# Patient Record
Sex: Male | Born: 1985 | Race: White | Hispanic: No | Marital: Single
Health system: Southern US, Community
[De-identification: ages and names within clinical notes are randomized; demographics above are authoritative.]

## PROBLEM LIST (undated history)

## (undated) DIAGNOSIS — K219 Gastro-esophageal reflux disease without esophagitis: Secondary | ICD-10-CM

## (undated) DIAGNOSIS — F419 Anxiety disorder, unspecified: Secondary | ICD-10-CM

## (undated) DIAGNOSIS — E669 Obesity, unspecified: Secondary | ICD-10-CM

## (undated) HISTORY — DX: Gastro-esophageal reflux disease without esophagitis: K21.9

## (undated) HISTORY — DX: Obesity, unspecified: E66.9

---

## 2008-10-25 ENCOUNTER — Emergency Department (HOSPITAL_COMMUNITY): Admission: EM | Admit: 2008-10-25 | Discharge: 2008-10-25 | Payer: Self-pay | Admitting: Emergency Medicine

## 2011-04-18 ENCOUNTER — Emergency Department (HOSPITAL_COMMUNITY): Payer: BC Managed Care – PPO

## 2011-04-18 ENCOUNTER — Emergency Department (HOSPITAL_COMMUNITY)
Admission: EM | Admit: 2011-04-18 | Discharge: 2011-04-19 | Disposition: A | Discharge status: Home / Self Care | Payer: BC Managed Care – PPO | Attending: Emergency Medicine | Admitting: Emergency Medicine

## 2011-04-18 DIAGNOSIS — R0989 Other specified symptoms and signs involving the circulatory and respiratory systems: Secondary | ICD-10-CM | POA: Insufficient documentation

## 2011-04-18 DIAGNOSIS — R55 Syncope and collapse: Secondary | ICD-10-CM | POA: Insufficient documentation

## 2011-04-18 DIAGNOSIS — R5381 Other malaise: Secondary | ICD-10-CM | POA: Insufficient documentation

## 2011-04-18 DIAGNOSIS — R209 Unspecified disturbances of skin sensation: Secondary | ICD-10-CM | POA: Insufficient documentation

## 2011-04-18 DIAGNOSIS — R0602 Shortness of breath: Secondary | ICD-10-CM | POA: Insufficient documentation

## 2011-04-18 DIAGNOSIS — R064 Hyperventilation: Secondary | ICD-10-CM | POA: Insufficient documentation

## 2011-04-18 DIAGNOSIS — F411 Generalized anxiety disorder: Secondary | ICD-10-CM | POA: Insufficient documentation

## 2011-04-18 DIAGNOSIS — R079 Chest pain, unspecified: Secondary | ICD-10-CM | POA: Insufficient documentation

## 2011-04-18 DIAGNOSIS — R0609 Other forms of dyspnea: Secondary | ICD-10-CM | POA: Insufficient documentation

## 2011-04-18 LAB — POCT I-STAT, CHEM 8
Chloride: 104 mEq/L (ref 96–112)
Glucose, Bld: 99 mg/dL (ref 70–99)
HCT: 48 % (ref 39.0–52.0)
Potassium: 3.8 mEq/L (ref 3.5–5.1)

## 2011-04-18 LAB — CK TOTAL AND CKMB (NOT AT ARMC)
CK, MB: 2.1 ng/mL (ref 0.3–4.0)
Relative Index: 1.3 (ref 0.0–2.5)
Total CK: 168 U/L (ref 7–232)

## 2011-04-18 LAB — DIFFERENTIAL
Eosinophils Relative: 0 % (ref 0–5)
Lymphocytes Relative: 16 % (ref 12–46)
Lymphs Abs: 2.3 10*3/uL (ref 0.7–4.0)
Neutrophils Relative %: 75 % (ref 43–77)

## 2011-04-18 LAB — CBC
HCT: 44.5 % (ref 39.0–52.0)
Hemoglobin: 15.6 g/dL (ref 13.0–17.0)
MCV: 89.2 fL (ref 78.0–100.0)
RBC: 4.99 MIL/uL (ref 4.22–5.81)
WBC: 14.4 10*3/uL — ABNORMAL HIGH (ref 4.0–10.5)

## 2011-04-18 LAB — D-DIMER, QUANTITATIVE: D-Dimer, Quant: 0.22 ug/mL-FEU (ref 0.00–0.48)

## 2011-04-19 LAB — POCT I-STAT TROPONIN I: Troponin i, poc: 0.01 ng/mL (ref 0.00–0.08)

## 2011-06-08 ENCOUNTER — Emergency Department (HOSPITAL_COMMUNITY)
Admission: EM | Admit: 2011-06-08 | Discharge: 2011-06-08 | Disposition: A | Discharge status: Home / Self Care | Payer: BC Managed Care – PPO | Attending: Emergency Medicine | Admitting: Emergency Medicine

## 2011-06-08 ENCOUNTER — Emergency Department (HOSPITAL_COMMUNITY): Payer: BC Managed Care – PPO

## 2011-06-08 DIAGNOSIS — R0989 Other specified symptoms and signs involving the circulatory and respiratory systems: Secondary | ICD-10-CM | POA: Insufficient documentation

## 2011-06-08 DIAGNOSIS — R209 Unspecified disturbances of skin sensation: Secondary | ICD-10-CM | POA: Insufficient documentation

## 2011-06-08 DIAGNOSIS — R0789 Other chest pain: Secondary | ICD-10-CM | POA: Insufficient documentation

## 2011-06-08 DIAGNOSIS — R000 Tachycardia, unspecified: Secondary | ICD-10-CM | POA: Insufficient documentation

## 2011-06-08 DIAGNOSIS — F411 Generalized anxiety disorder: Secondary | ICD-10-CM | POA: Insufficient documentation

## 2011-06-08 DIAGNOSIS — Z79899 Other long term (current) drug therapy: Secondary | ICD-10-CM | POA: Insufficient documentation

## 2011-06-08 DIAGNOSIS — R0602 Shortness of breath: Secondary | ICD-10-CM | POA: Insufficient documentation

## 2011-06-08 DIAGNOSIS — R0609 Other forms of dyspnea: Secondary | ICD-10-CM | POA: Insufficient documentation

## 2011-06-08 DIAGNOSIS — R002 Palpitations: Secondary | ICD-10-CM | POA: Insufficient documentation

## 2011-06-08 LAB — POCT I-STAT, CHEM 8
BUN: 12 mg/dL (ref 6–23)
Calcium, Ion: 1.12 mmol/L (ref 1.12–1.32)
Creatinine, Ser: 1.1 mg/dL (ref 0.50–1.35)
TCO2: 24 mmol/L (ref 0–100)

## 2011-06-08 LAB — DIFFERENTIAL
Eosinophils Relative: 1 % (ref 0–5)
Lymphocytes Relative: 30 % (ref 12–46)
Lymphs Abs: 2.8 10*3/uL (ref 0.7–4.0)
Monocytes Absolute: 0.7 10*3/uL (ref 0.1–1.0)
Neutro Abs: 5.5 10*3/uL (ref 1.7–7.7)

## 2011-06-08 LAB — CBC
HCT: 45.2 % (ref 39.0–52.0)
Hemoglobin: 15.9 g/dL (ref 13.0–17.0)
MCHC: 35.2 g/dL (ref 30.0–36.0)
MCV: 90 fL (ref 78.0–100.0)
RDW: 13.3 % (ref 11.5–15.5)
WBC: 9.1 10*3/uL (ref 4.0–10.5)

## 2011-07-11 ENCOUNTER — Encounter: Payer: Self-pay | Admitting: *Deleted

## 2011-07-11 ENCOUNTER — Emergency Department (HOSPITAL_COMMUNITY)
Admission: EM | Admit: 2011-07-11 | Discharge: 2011-07-11 | Disposition: A | Discharge status: Home / Self Care | Payer: BC Managed Care – PPO | Attending: Emergency Medicine | Admitting: Emergency Medicine

## 2011-07-11 DIAGNOSIS — F411 Generalized anxiety disorder: Secondary | ICD-10-CM | POA: Insufficient documentation

## 2011-07-11 DIAGNOSIS — F419 Anxiety disorder, unspecified: Secondary | ICD-10-CM

## 2011-07-11 HISTORY — DX: Anxiety disorder, unspecified: F41.9

## 2011-07-11 MED ORDER — ALPRAZOLAM 0.25 MG PO TABS: 0.2500 mg | ORAL_TABLET | Freq: Every evening | ORAL | Status: DC | PRN

## 2011-07-11 NOTE — ED Provider Notes (Signed)
History     CSN: [id number] Arrival date & time: 07/11/2011  5:32 PM   First MD Initiated Contact with Patient 07/11/11 1826      Chief Complaint  Patient presents with  . Anxiety    (Consider location/radiation/quality/duration/timing/severity/associated sxs/prior treatment) Patient is a 25 y.o. male presenting with anxiety. The history is provided by the patient.  Anxiety This is a chronic problem. The current episode started more than 1 year ago. The problem occurs every several days. The problem has been resolved. Pertinent negatives include no abdominal pain, anorexia, arthralgias, change in bowel habit, chest pain, chills, fatigue, fever, headaches, joint swelling, rash, sore throat, swollen glands or urinary symptoms. The symptoms are aggravated by nothing.   Pt presents to the ED with complaints of recurrent anxiety attacks. He sees a primary care provider at [facility] for it and is out of his Xanax. He took his last pill yesterday with the extra anxiety. He states that he is going to see his doctor tomorrow for more refills.  Past Medical History  Diagnosis Date  . Anxiety     History reviewed. No pertinent past surgical history.  No family history on file.  History  Substance Use Topics  . Smoking status: Not on file  . Smokeless tobacco: Not on file  . Alcohol Use:       Review of Systems  Constitutional: Negative for fever, chills and fatigue.  HENT: Negative for sore throat.   Cardiovascular: Negative for chest pain.  Gastrointestinal: Negative for abdominal pain, anorexia and change in bowel habit.  Musculoskeletal: Negative for joint swelling and arthralgias.  Skin: Negative for rash.  Neurological: Negative for headaches.  All other systems reviewed and are negative.    Allergies  Penicillins  Home Medications   Current Outpatient Rx  Name Route Sig Dispense Refill  . ALPRAZOLAM 0.25 MG PO TABS Oral Take 0.25 mg by mouth 3 (three) times daily as  needed. For anxiety.       BP 144/86  Pulse 23  Temp(Src) 99.3 F (37.4 C) (Oral)  Resp 23  SpO2 99%  Physical Exam  Nursing note and vitals reviewed. Constitutional: He is oriented to person, place, and time. He appears well-developed and well-nourished.  HENT:  Head: Normocephalic and atraumatic.  Eyes: EOM are normal. Pupils are equal, round, and reactive to light.  Neck: Normal range of motion.  Cardiovascular: Normal rate and regular rhythm.   Pulmonary/Chest: Effort normal and breath sounds normal.  Musculoskeletal: Normal range of motion.  Neurological: He is alert and oriented to person, place, and time.  Skin: Skin is warm and dry.    ED Course  Procedures (including critical care time)  Labs Reviewed - No data to display No results found.   No diagnosis found.    MDM         Dorthula Matas, PA 07/11/11 [phone]

## 2011-07-11 NOTE — ED Notes (Signed)
  Per [facility]- pt reports indigestion about 1 our ago- Per EMS seen and treated multiple times for presenting complaint- Pt reports out of anxiety medication last xanax taken 1 hour ago.

## 2011-07-11 NOTE — ED Provider Notes (Signed)
Medical screening examination/treatment/procedure(s) were performed by non-physician practitioner and as supervising physician I was immediately available for consultation/collaboration.   Joya Gaskins, MD 07/11/11 2325

## 2011-07-29 ENCOUNTER — Emergency Department (HOSPITAL_COMMUNITY)
Admission: EM | Admit: 2011-07-29 | Discharge: 2011-07-29 | Discharge status: Against Medical Advice | Payer: BC Managed Care – PPO | Attending: Emergency Medicine | Admitting: Emergency Medicine

## 2011-07-29 ENCOUNTER — Encounter (HOSPITAL_COMMUNITY): Payer: Self-pay | Admitting: *Deleted

## 2011-07-29 DIAGNOSIS — R079 Chest pain, unspecified: Secondary | ICD-10-CM | POA: Insufficient documentation

## 2011-07-29 DIAGNOSIS — R0602 Shortness of breath: Secondary | ICD-10-CM | POA: Insufficient documentation

## 2011-07-29 NOTE — ED Notes (Signed)
Has been having chest pain, radiation to left arm, sob, dizziness x 4 months, has been seen here for same in past. No acute distress noted at triage.

## 2014-03-16 ENCOUNTER — Encounter: Payer: Self-pay | Admitting: *Deleted

## 2018-06-03 ENCOUNTER — Emergency Department: Payer: BLUE CROSS/BLUE SHIELD

## 2018-06-03 ENCOUNTER — Other Ambulatory Visit: Payer: Self-pay

## 2018-06-03 ENCOUNTER — Emergency Department
Admission: EM | Admit: 2018-06-03 | Discharge: 2018-06-04 | Disposition: A | Discharge status: Home / Self Care | Payer: BLUE CROSS/BLUE SHIELD | Attending: Emergency Medicine | Admitting: Emergency Medicine

## 2018-06-03 DIAGNOSIS — Z23 Encounter for immunization: Secondary | ICD-10-CM | POA: Diagnosis not present

## 2018-06-03 DIAGNOSIS — Y999 Unspecified external cause status: Secondary | ICD-10-CM | POA: Insufficient documentation

## 2018-06-03 DIAGNOSIS — Y9389 Activity, other specified: Secondary | ICD-10-CM | POA: Diagnosis not present

## 2018-06-03 DIAGNOSIS — S61212A Laceration without foreign body of right middle finger without damage to nail, initial encounter: Secondary | ICD-10-CM | POA: Diagnosis not present

## 2018-06-03 DIAGNOSIS — W25XXXA Contact with sharp glass, initial encounter: Secondary | ICD-10-CM | POA: Diagnosis not present

## 2018-06-03 DIAGNOSIS — Y92018 Other place in single-family (private) house as the place of occurrence of the external cause: Secondary | ICD-10-CM | POA: Insufficient documentation

## 2018-06-03 DIAGNOSIS — Z79899 Other long term (current) drug therapy: Secondary | ICD-10-CM | POA: Insufficient documentation

## 2018-06-03 MED ORDER — LIDOCAINE HCL (PF) 1 % IJ SOLN
5.0000 mL | Freq: Once | INTRAMUSCULAR | Status: AC
  Administered 2018-06-04: 5 mL
  Filled 2018-06-03: qty 5

## 2018-06-03 NOTE — ED Triage Notes (Addendum)
Pt in with co laceration to right 3rd finger with glass cup at home.

## 2018-06-04 MED ORDER — OXYCODONE-ACETAMINOPHEN 5-325 MG PO TABS
1.0000 | ORAL_TABLET | Freq: Once | ORAL | Status: AC
  Administered 2018-06-04: 1 via ORAL
  Filled 2018-06-04: qty 1

## 2018-06-04 MED ORDER — TETANUS-DIPHTH-ACELL PERTUSSIS 5-2.5-18.5 LF-MCG/0.5 IM SUSP
0.5000 mL | Freq: Once | INTRAMUSCULAR | Status: AC
  Administered 2018-06-04: 0.5 mL via INTRAMUSCULAR
  Filled 2018-06-04: qty 0.5

## 2018-06-04 NOTE — Discharge Instructions (Signed)
Keep laceration dry and clean. Wash with warm water and soap. Apply topical bacitracin. Protect from the sun to minimize scarring.  You received 3 stitches that must be removed in 7 days.  Watch for signs of infection: pus, redness of the skin surrounding it, or fever. If these develop see your doctor or return to the ER for antibiotics.

## 2018-06-04 NOTE — ED Provider Notes (Signed)
[facility] Emergency Department Provider Note  ____________________________________________  Time seen: Approximately 12:52 AM  I have reviewed the triage vital signs and the nursing notes.   HISTORY  Chief Complaint Laceration   HPI Devin Sullivan is a 32 y.o. male who presents for evaluation of a finger laceration.  Patient reports that he cut his finger at a broken glass cup at home.  No laceration happened just prior to arrival.  Patient is complaining of moderate sharp pain that is constant since it happened.  No other injuries.  Last tetanus shot is unknown.  Past Medical History:  Diagnosis Date  . Anxiety   . GERD (gastroesophageal reflux disease)   . Obesity     There are no active problems to display for this patient.   No past surgical history on file.  Prior to Admission medications   Medication Sig Start Date End Date Taking? Authorizing Provider  ALPRAZolam (XANAX) 0.25 MG tablet Take 0.25 mg by mouth 3 (three) times daily as needed. For anxiety.     [provider]  PARoxetine (PAXIL) 10 MG tablet Take 10 mg by mouth every morning.      [provider]    Allergies Penicillins  No family history on file.  Social History Social History   Tobacco Use  . Smoking status: Not on file  Substance Use Topics  . Alcohol use: No  . Drug use: No    Review of Systems  Constitutional: Negative for fever. Cardiovascular: Negative for chest pain. Respiratory: Negative for shortness of breath. Gastrointestinal: Negative for abdominal pain, vomiting or diarrhea. Musculoskeletal: Negative for back pain. + finger laceration Skin: Negative for rash. Neurological: Negative for headaches, weakness or numbness. Psych: No SI or HI  ____________________________________________   PHYSICAL EXAM:  VITAL SIGNS: ED Triage Vitals  Enc Vitals Group     BP 06/03/18 2336 133/85     Pulse Rate 06/03/18 2335 76   Resp 06/03/18 2335 20     Temp 06/03/18 2335 97.6 F (36.4 C)     Temp Source 06/03/18 2335 Oral     SpO2 06/03/18 2335 99 %     Weight 06/03/18 2335 280 lb (127 kg)     Height 06/03/18 2335 6\' 2"  (1.88 m)     Head Circumference --      Peak Flow --      Pain Score 06/03/18 2335 8     Pain Loc --      Pain Edu? --      Excl. in GC? --     Constitutional: Alert and oriented. Well appearing and in no apparent distress. HEENT:      Head: Normocephalic and atraumatic.         Eyes: Conjunctivae are normal. Sclera is non-icteric.       Mouth/Throat: Mucous membranes are moist.       Neck: Supple with no signs of meningismus. Cardiovascular: Regular rate and rhythm. No murmurs, gallops, or rubs. 2+ symmetrical distal pulses are present in all extremities. No JVD. Respiratory: Normal respiratory effort. Lungs are clear to auscultation bilaterally. No wheezes, crackles, or rhonchi.  Musculoskeletal: Superficial 2 cm laceration located over the right third PIP joint in the volar aspect, no tendon involvement. Neurologic: Normal speech and language. Face is symmetric. Moving all extremities. No gross focal neurologic deficits are appreciated. Skin: Skin is warm, dry and intact. No rash noted. Psychiatric: Mood and affect are normal. Speech and behavior are  normal.  ____________________________________________   LABS (all labs ordered are listed, but only abnormal results are displayed)  Labs Reviewed - No data to display ____________________________________________  EKG  none  ____________________________________________  RADIOLOGY  I have personally reviewed the images performed during this visit and I agree with the Radiologist's read.   Interpretation by Radiologist:  Dg Hand 2 View Right  Result Date: 06/04/2018 CLINICAL DATA:  Laceration third digit with glass EXAM: RIGHT HAND - 2 VIEW COMPARISON:  None. FINDINGS: There is no evidence of fracture or dislocation. There is  no evidence of arthropathy or other focal bone abnormality. Soft tissues are unremarkable. IMPRESSION: Negative. Electronically Signed   By: Jasmine Pang M.D.   On: 06/04/2018 00:28     ____________________________________________   PROCEDURES  Procedure(s) performed:yes .Marland KitchenLaceration Repair Date/Time: 06/04/2018 12:54 AM Performed by: Nita Sickle, MD Authorized by: Nita Sickle, MD   Consent:    Consent obtained:  Verbal   Consent given by:  Patient   Risks discussed:  Infection, pain, retained foreign body, poor cosmetic result and poor wound healing Anesthesia (see MAR for exact dosages):    Anesthesia method:  Nerve block   Block location:  Finger   Block needle gauge:  25 G   Block anesthetic:  Lidocaine 1% w/o epi   Block injection procedure:  Anatomic landmarks identified   Block outcome:  Anesthesia achieved Laceration details:    Location:  Finger   Finger location:  R long finger   Length (cm):  2 Repair type:    Repair type:  Simple Exploration:    Hemostasis achieved with:  Direct pressure   Wound exploration: entire depth of wound probed and visualized     Wound extent: no foreign bodies/material noted, no tendon damage noted and no underlying fracture noted     Contaminated: no   Treatment:    Area cleansed with:  Saline   Amount of cleaning:  Extensive   Irrigation solution:  Sterile saline   Visualized foreign bodies/material removed: no   Skin repair:    Repair method:  Sutures   Suture size:  5-0   Suture material:  Nylon   Suture technique:  Simple interrupted   Number of sutures:  3 Approximation:    Approximation:  Close Post-procedure details:    Dressing:  Adhesive bandage   Patient tolerance of procedure:  Tolerated well, no immediate complications   Critical Care performed:  None ____________________________________________   INITIAL IMPRESSION / ASSESSMENT AND PLAN / ED COURSE  32 y.o. male who presents for evaluation  of a finger laceration.  Neurovascular exam is intact.  No tendon injury.  Laceration repair per procedure note above.  Discussed wound care with patient and his wife and return precautions for any signs of infection.  Otherwise follow-up in 7 days for stitch removal.  Tetanus updated.      As part of my medical decision making, I reviewed the following data within the electronic MEDICAL RECORD NUMBER Nursing notes reviewed and incorporated, Radiograph reviewed , Notes from prior ED visits and [place] Controlled Substance Database    Pertinent labs & imaging results that were available during my care of the patient were reviewed by me and considered in my medical decision making (see chart for details).    ____________________________________________   FINAL CLINICAL IMPRESSION(S) / ED DIAGNOSES  Final diagnoses:  Laceration of right middle finger without foreign body without damage to nail, initial encounter      NEW MEDICATIONS  STARTED DURING THIS VISIT:  ED Discharge Orders    None       Note:  This document was prepared using Dragon voice recognition software and may include unintentional dictation errors.    Nita Sickle, MD 06/04/18 [phone]

## 2020-09-04 IMAGING — DX DG HAND 2V*R*
2 series · 2 of 2 positions shown · non-contrast
Comparison: None.

CLINICAL DATA: Laceration third digit with glass

EXAM:
RIGHT HAND - 2 VIEW

[hand ap]
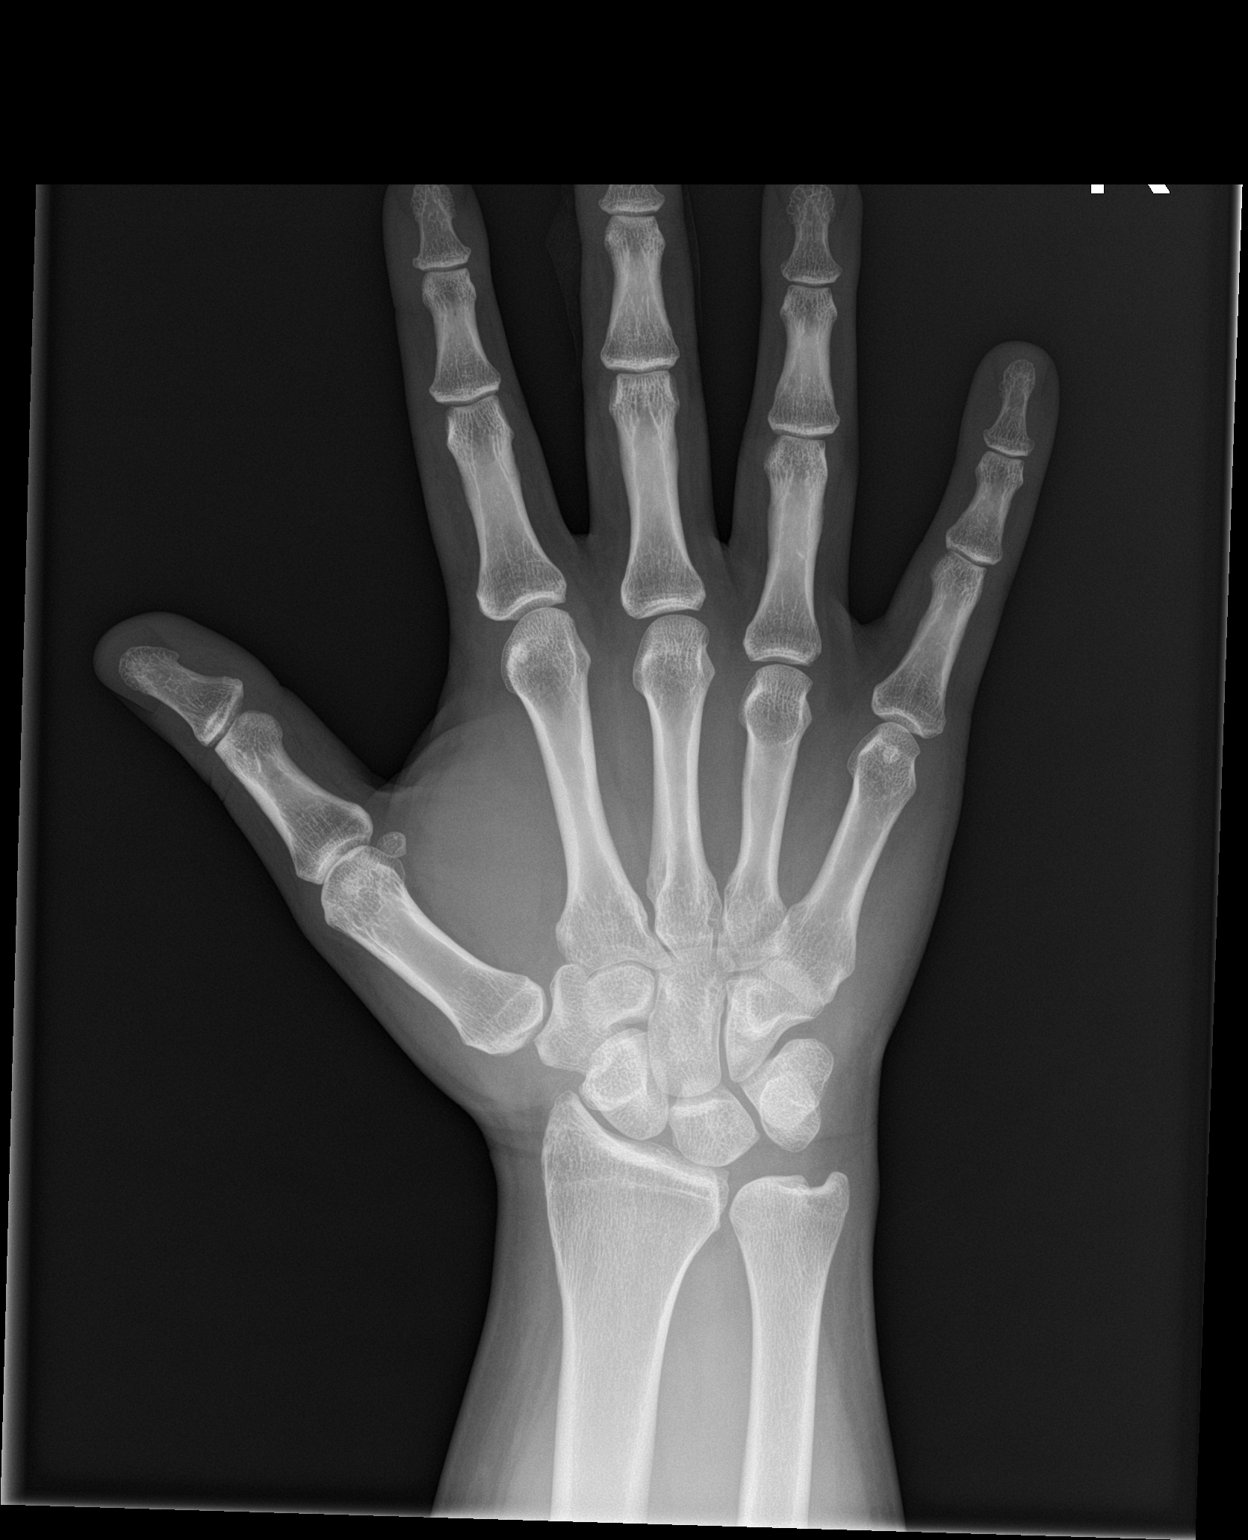

[hand lat]
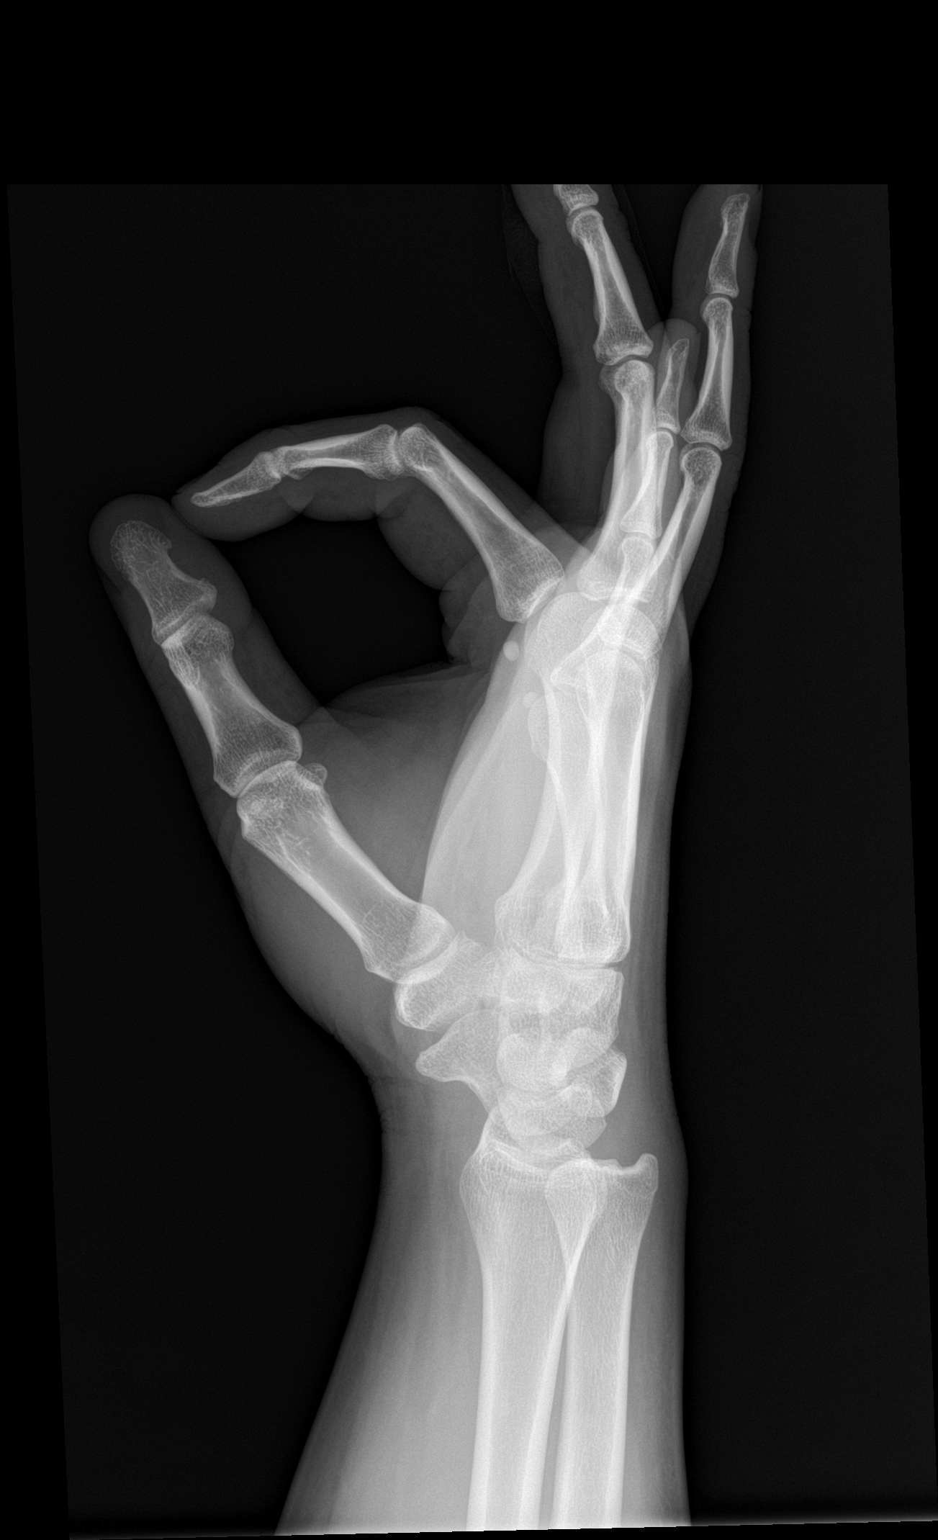

[2 of 2 positions shown; findings below may reference images not displayed]

FINDINGS: There is no evidence of fracture or dislocation. There is no
evidence of arthropathy or other focal bone abnormality. Soft
tissues are unremarkable.
IMPRESSION: Negative.
